# Patient Record
Sex: Female | Born: 1965 | Race: White | Hispanic: No | Marital: Married | Smoking: Former smoker
Health system: Southern US, Community
[De-identification: ages and names within clinical notes are randomized; demographics above are authoritative.]

---

## 1997-07-02 ENCOUNTER — Ambulatory Visit (HOSPITAL_COMMUNITY): Admission: RE | Admit: 1997-07-02 | Discharge: 1997-07-02 | Payer: Self-pay | Admitting: Family Medicine

## 1997-11-09 ENCOUNTER — Emergency Department (HOSPITAL_COMMUNITY): Admission: EM | Admit: 1997-11-09 | Discharge: 1997-11-09 | Payer: Self-pay | Admitting: Emergency Medicine

## 1999-08-15 ENCOUNTER — Emergency Department (HOSPITAL_COMMUNITY): Admission: EM | Admit: 1999-08-15 | Discharge: 1999-08-15 | Payer: Self-pay | Admitting: Emergency Medicine

## 2000-12-12 ENCOUNTER — Ambulatory Visit (HOSPITAL_COMMUNITY): Admission: RE | Admit: 2000-12-12 | Discharge: 2000-12-12 | Payer: Self-pay | Admitting: Internal Medicine

## 2000-12-12 ENCOUNTER — Encounter: Payer: Self-pay | Admitting: Internal Medicine

## 2002-05-20 ENCOUNTER — Encounter: Payer: Self-pay | Admitting: Family Medicine

## 2002-05-20 ENCOUNTER — Ambulatory Visit (HOSPITAL_COMMUNITY): Admission: RE | Admit: 2002-05-20 | Discharge: 2002-05-20 | Payer: Self-pay | Admitting: Family Medicine

## 2004-05-05 ENCOUNTER — Emergency Department (HOSPITAL_COMMUNITY): Admission: EM | Admit: 2004-05-05 | Discharge: 2004-05-05 | Payer: Self-pay | Admitting: Emergency Medicine

## 2005-03-27 ENCOUNTER — Inpatient Hospital Stay (HOSPITAL_COMMUNITY): Admission: EM | Admit: 2005-03-27 | Discharge: 2005-04-05 | Payer: Self-pay | Admitting: Emergency Medicine

## 2005-04-06 ENCOUNTER — Ambulatory Visit: Payer: Self-pay | Admitting: Family Medicine

## 2005-07-22 ENCOUNTER — Emergency Department (HOSPITAL_COMMUNITY): Admission: EM | Admit: 2005-07-22 | Discharge: 2005-07-22 | Payer: Self-pay | Admitting: Emergency Medicine

## 2006-05-12 ENCOUNTER — Emergency Department (HOSPITAL_COMMUNITY): Admission: EM | Admit: 2006-05-12 | Discharge: 2006-05-12 | Payer: Self-pay | Admitting: Emergency Medicine

## 2007-07-20 IMAGING — US US PELVIS COMPLETE MODIFY
1 series · 14 of 25 positions shown · non-contrast
Comparison: none

CLINICAL DATA: Pelvic pain.  Cystic pelvic mass and enlarged uterus seen on CT.
 TRANSABDOMINAL AND TRANSVAGINAL PELVIC ULTRASOUND:
TECHNIQUE: Both transabdominal and transvaginal ultrasound examinations of the pelvis were performed including evaluation of the uterus, ovaries, adnexal regions, and pelvic cul-de-sac.

[Series 1: gyn · 0.27mm/px · 14 of 61 slices shown]
[im 1/61]
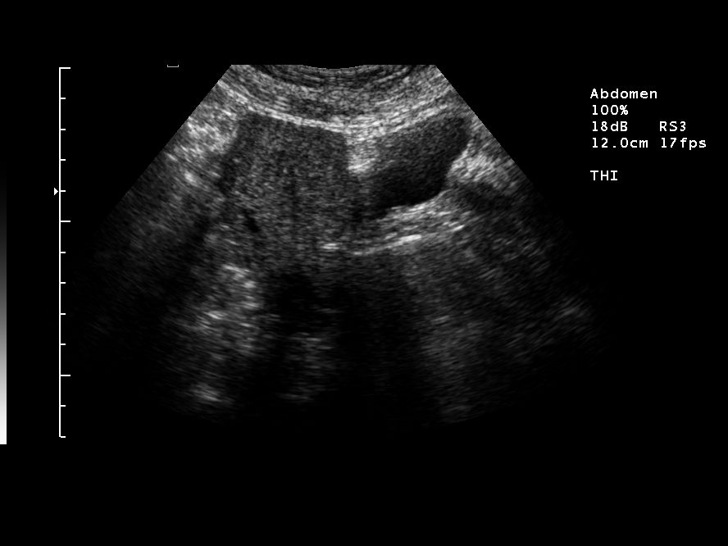
[im 6/61]
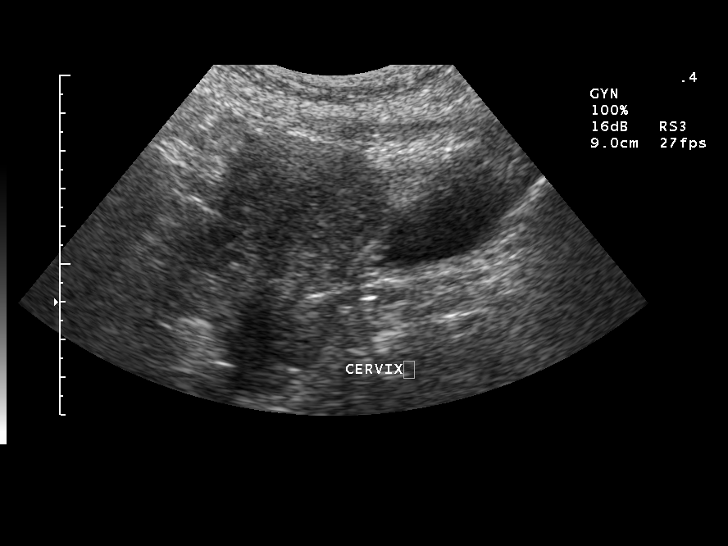
[im 11/61]
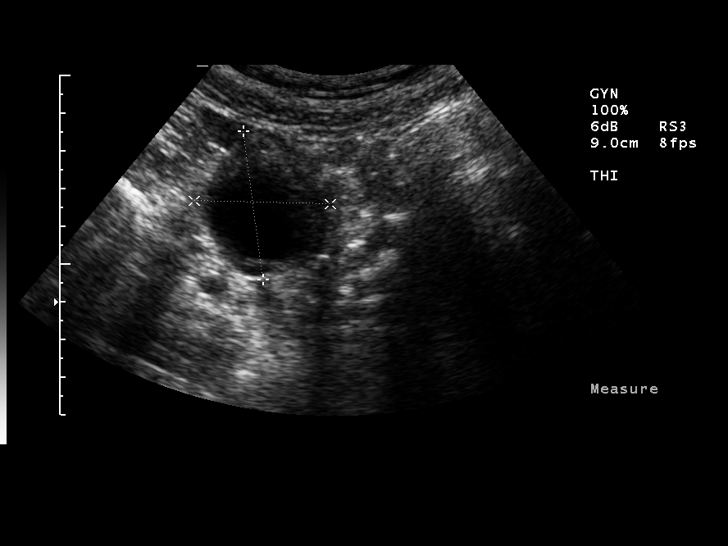
[im 16/61]
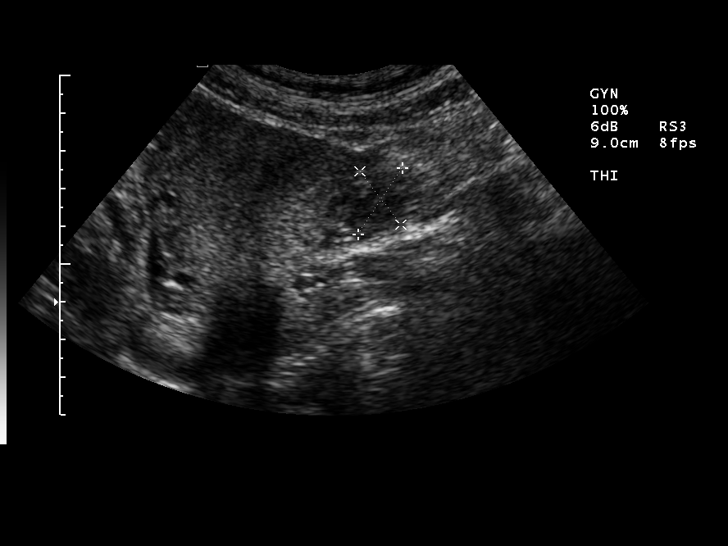
[im 21/61]
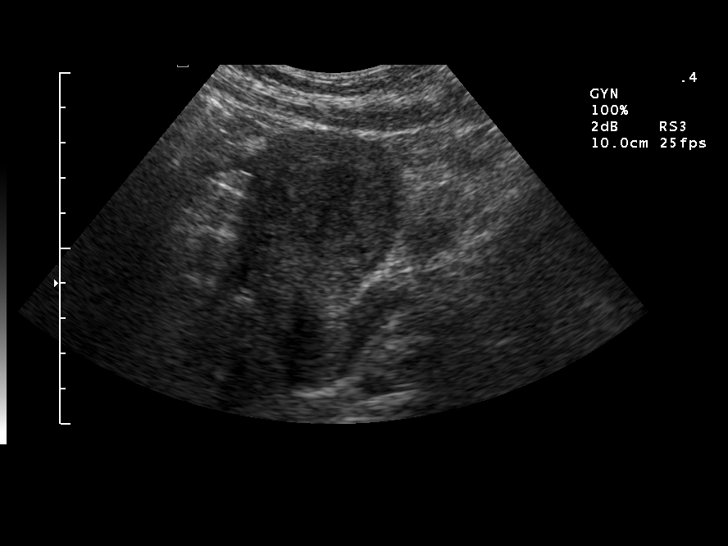
[im 23/61]
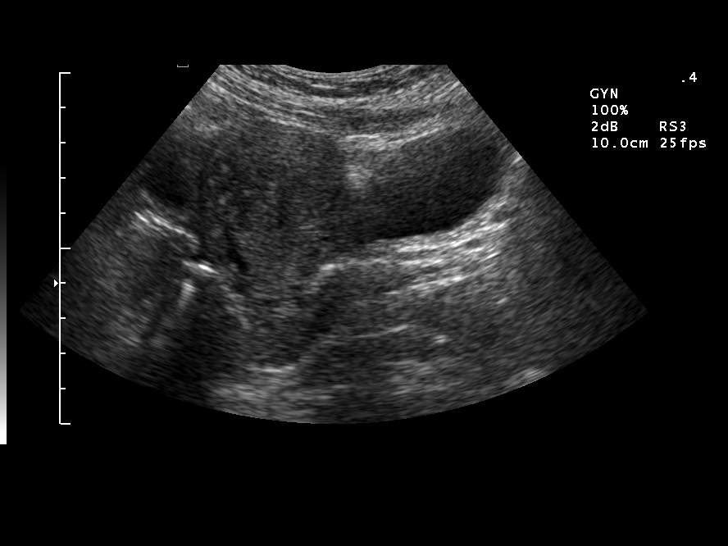
[im 28/61]
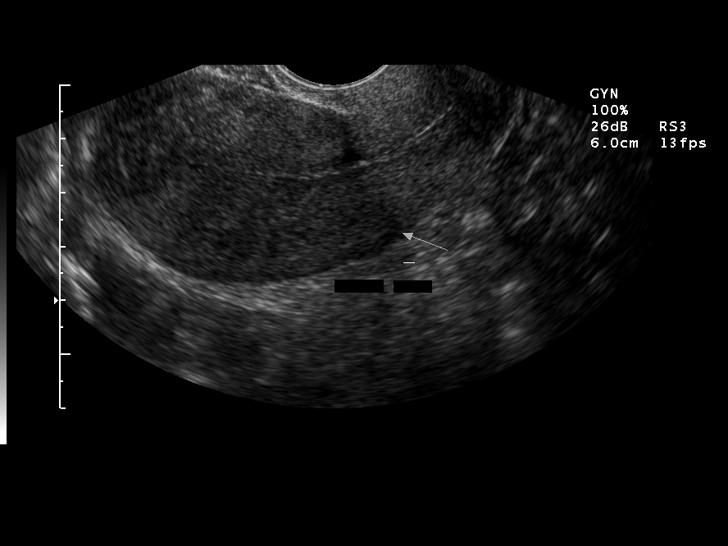
[im 33/61]
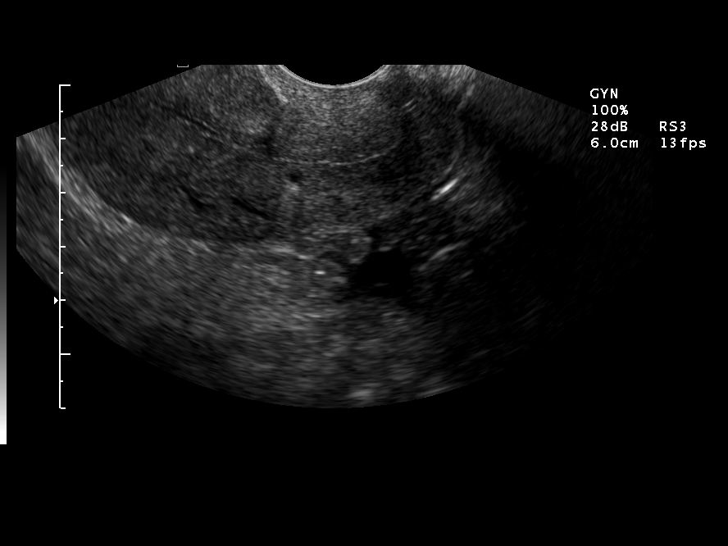
[im 38/61]
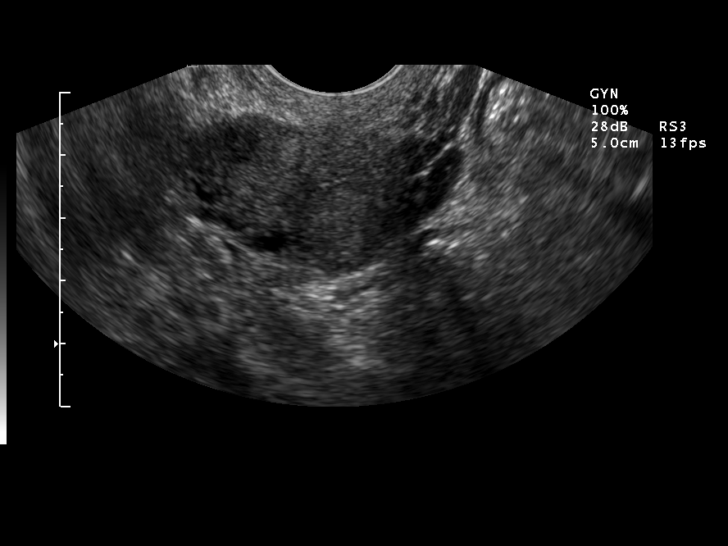
[im 41/61]
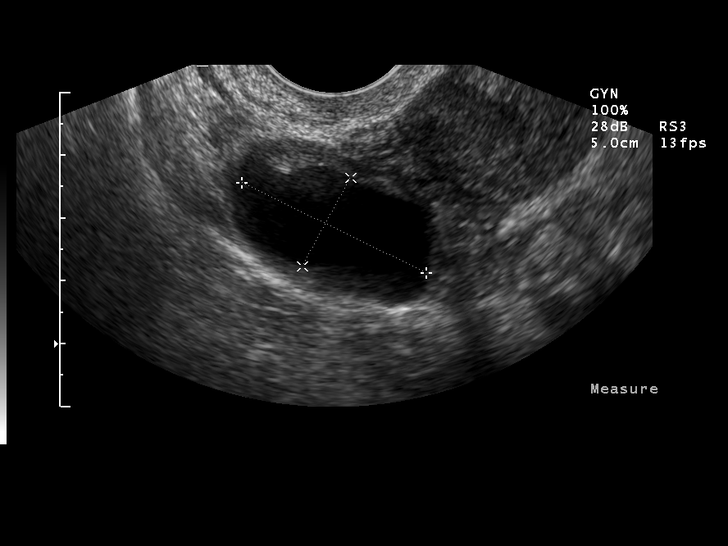
[im 46/61]
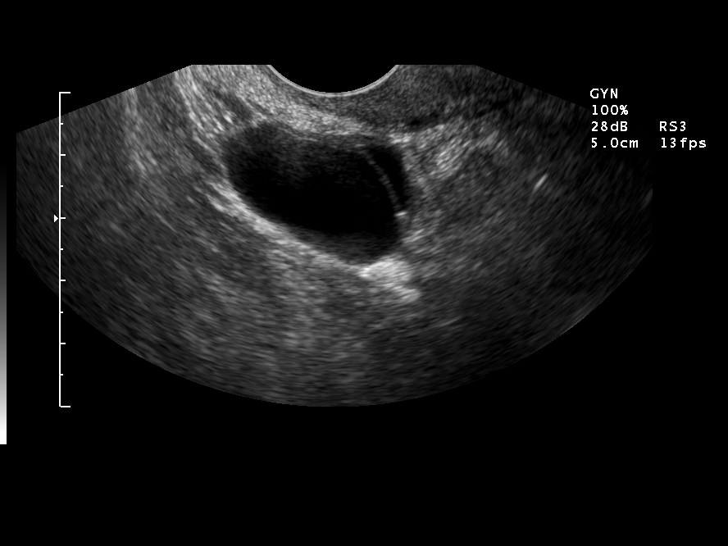
[im 51/61]
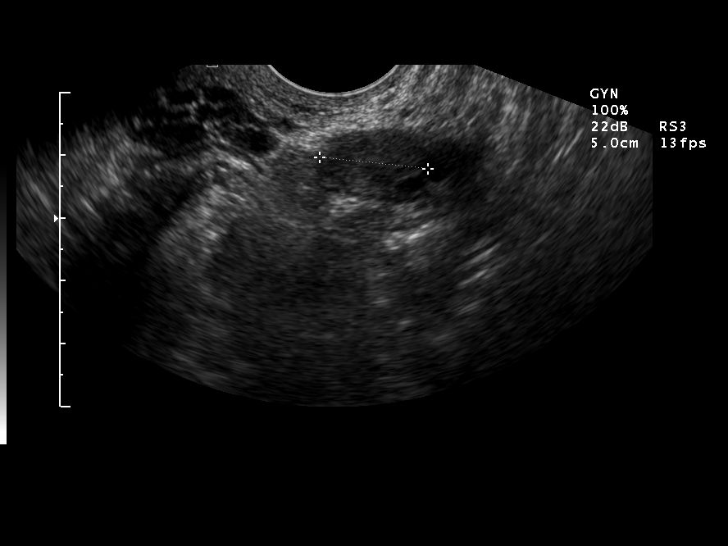
[im 56/61]
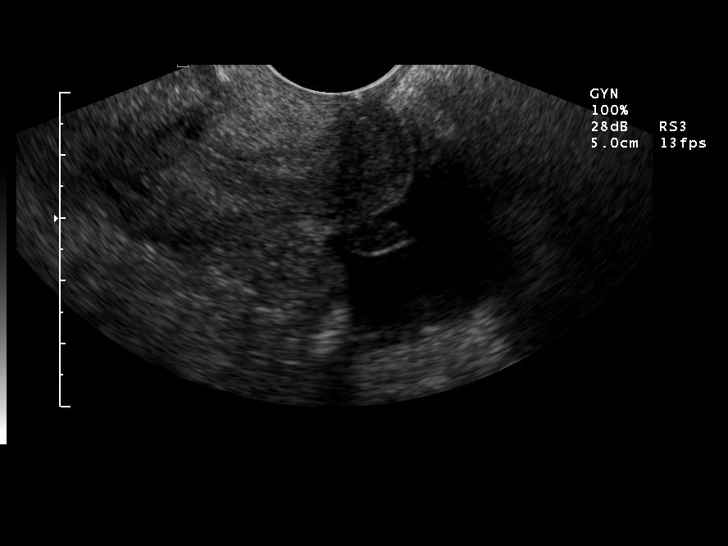
[im 61/61]
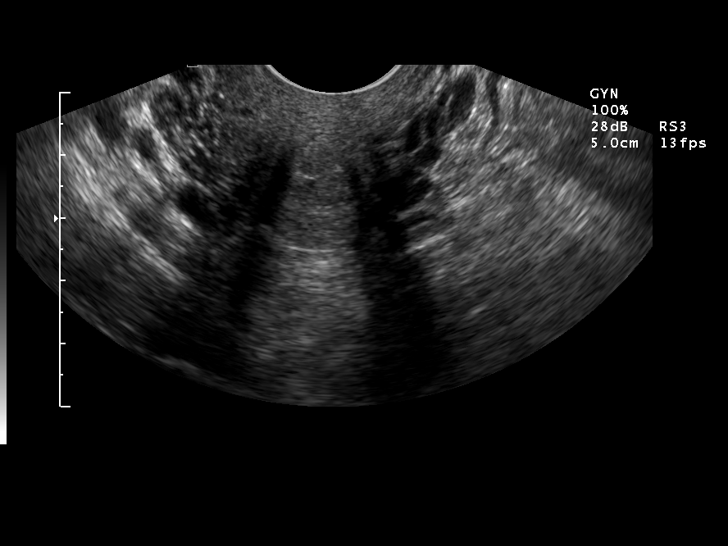

[14 of 25 positions shown; findings below may reference images not displayed]

FINDINGS: The uterus is normal in size.  No fibroids or other uterine masses are seen.  A thin endometrium is seen measuring approximately 4 mm in maximum double-layer thickness.  Prior C-section scar is incidentally noted.  
 A cystic lesion is seen involving the right ovary which has several thin septations along its medial aspect.  This measures 3.3 x 1.6 x 1.5 cm.  No mural nodularity, thickened septation, or solid component is identified.  
 The left ovary is normal in appearance.  No other adnexal masses are seen.  A trace amount of fluid is noted in the pelvic cul-de-sac.
IMPRESSION: 1.  3.5 cm complex septated right ovarian cyst.  This may represent a functional ovarian cyst, and follow-up transvaginal ultrasound is recommended in six weeks.
 2.  Normal appearance of the left ovary and uterus.

## 2013-08-05 ENCOUNTER — Ambulatory Visit: Payer: Self-pay

## 2013-08-06 ENCOUNTER — Encounter: Payer: Self-pay | Admitting: Internal Medicine

## 2013-08-06 ENCOUNTER — Ambulatory Visit: Payer: Self-pay | Attending: Internal Medicine | Admitting: Internal Medicine

## 2013-08-06 VITALS — BP 104/71 | HR 88 | Temp 98.0°F | Resp 16 | Ht 64.0 in | Wt 191.2 lb

## 2013-08-06 DIAGNOSIS — L409 Psoriasis, unspecified: Secondary | ICD-10-CM

## 2013-08-06 DIAGNOSIS — Z139 Encounter for screening, unspecified: Secondary | ICD-10-CM

## 2013-08-06 DIAGNOSIS — L408 Other psoriasis: Secondary | ICD-10-CM

## 2013-08-06 DIAGNOSIS — Z8679 Personal history of other diseases of the circulatory system: Secondary | ICD-10-CM

## 2013-08-06 DIAGNOSIS — R609 Edema, unspecified: Secondary | ICD-10-CM

## 2013-08-06 MED ORDER — CLOBETASOL PROPIONATE 0.05 % EX CREA
1.0000 | TOPICAL_CREAM | Freq: Two times a day (BID) | CUTANEOUS | Status: AC
Start: 2013-08-06 — End: ?

## 2013-08-06 MED ORDER — FUROSEMIDE 20 MG PO TABS: 20.0000 mg | ORAL_TABLET | Freq: Every day | ORAL | Status: AC

## 2013-08-06 NOTE — Progress Notes (Signed)
Patient here to establish care.  Patient has a psoriasis flare up. Patient also has bilateral lower extremity edema (2-3+), with tenderness, itching and tingling for 2-3 months Hx of mitral valve prolapse dx at age 48 or [age].  Unknown last mammogram, patient has breast implants.  Patient c/o numbness in left arm.

## 2013-08-06 NOTE — Progress Notes (Signed)
Patient ID: Monica Ferguson, female   DOB: 02/13/1966, 48 y.o.   MRN: [id number]  [id number]  [id number]  DOB - 01/21/1966  CC:  Chief Complaint  Patient presents with  . Establish Care       HPI: Monica Ferguson is a 48 y.o. female here today to establish medical care.  Patient is having a psoriasis exacerbation, she reports that she ha been using halobetasol cream that usually helps.  She reports that she has had current flare for 6-8 months.  Patient reports that she was diagnosed with psoriasis at age 89, but has never had a flare this bad.  Has had BLE swelling for past 3 weeks.  Reports random chest pain/pressure yesterday.  Denies SOB. Patient is worried about her current breast implant from a fall she had several months ago.  Patient also reports a history of mitral valve prolapse and is worried that her BLE swelling is from her heart.     No Known Allergies No past medical history on file. No current outpatient prescriptions on file prior to visit.   No current facility-administered medications on file prior to visit.   No family history on file. History   Social History  . Marital Status: Married    Spouse Name: N/A    Number of Children: N/A  . Years of Education: N/A   Occupational History  . Not on file.   Social History Main Topics  . Smoking status: Former Smoker    Quit date: 08/06/2012  . Smokeless tobacco: Not on file  . Alcohol Use: Not on file  . Drug Use: Not on file  . Sexual Activity: Not on file   Other Topics Concern  . Not on file   Social History Narrative  . No narrative on file   Review of Systems  HENT: Negative.   Eyes: Negative.   Respiratory: Negative for cough and shortness of breath.   Cardiovascular: Positive for leg swelling (BLE). Negative for chest pain and palpitations.  Gastrointestinal: Negative.   Genitourinary: Negative.   Musculoskeletal: Negative.   Neurological: Positive for tingling (numbness in left arm).  Negative for dizziness and weakness.  Endo/Heme/Allergies: Negative.   Psychiatric/Behavioral: Negative.      Objective:   Filed Vitals:   08/06/13 1419  BP: 104/71  Pulse: 88  Temp: 98 F (36.7 C)  Resp: 16    Physical Exam: Constitutional: Patient appears well-developed and well-nourished. No distress. HENT: Normocephalic, atraumatic, External right and left ear normal. Oropharynx is clear and moist.  Eyes: Conjunctivae and EOM are normal. PERRLA, no scleral icterus. Neck: Normal ROM. Neck supple. No JVD. No tracheal deviation. No thyromegaly. CVS: RRR, S1/S2 +, no murmurs, no gallops, no carotid bruit.  Pulmonary: Effort and breath sounds normal, no stridor, rhonchi, wheezes, rales.  Abdominal: Soft. BS +, no distension, tenderness, rebound or guarding.  Musculoskeletal: Normal range of motion. 2+ non-pitting edema in BLE.  Lymphadenopathy: No lymphadenopathy noted, cervical Neuro: Alert. Normal reflexes, muscle tone coordination. No cranial nerve deficit. Skin: Skin is warm and dry. No rash noted. Not diaphoretic. No erythema. No pallor. Psychiatric: Normal mood and affect. Behavior, judgment, thought content normal.  No results found for this basename: WBC, HGB, HCT, MCV, PLT   No results found for this basename: CREATININE, BUN, NA, K, CL, CO2    No results found for this basename: HGBA1C   Lipid Panel  No results found for this basename: chol, trig, hdl, cholhdl, vldl, ldlcalc  Assessment and plan:   Bufford Lopeudra was seen today for no specified reason.  Diagnoses and associated orders for this visit:  Edema - furosemide (LASIX) 20 MG tablet; Take 1 tablet (20 mg total) by mouth daily. Patient should elevate feet when at home. If no improvement in 2 weeks, RTC.  History of mitral valve prolapse - 2D Echocardiogram without contrast; Future  Psoriasis - clobetasol cream (TEMOVATE) 0.05 %; Apply 1 application topically 2 (two) times daily. - Ambulatory  referral to Rheumatology  Screening - Cancel: MM Digital Screening; Future - COMPLETE METABOLIC PANEL WITH GFR; Future - CBC; Future - TSH; Future - Hemoglobin A1c; Future - Lipid panel; Future - MM DIGITAL SCREENING W/ IMPLANTS BILATERAL; Future   Return in about 1 day (around 08/07/2013) for lab, 3 mon PCP.  The patient was given clear instructions to go to ER or return to medical center if symptoms don't improve, worsen or new problems develop.   Holland CommonsKECK, VALERIE, NP-C [facility] [phone] 08/10/2013, 2:47 PM

## 2013-08-06 NOTE — Patient Instructions (Signed)
Edema Edema is an abnormal buildup of fluids in your bodytissues. Edema is somewhatdependent on gravity to pull the fluid to the lowest place in your body. That makes the condition more common in the legs and thighs (lower extremities). Painless swelling of the feet and ankles is common and becomes more likely as you get older. It is also common in looser tissues, like around your eyes.  When the affected area is squeezed, the fluid may move out of that spot and leave a dent for a few moments. This dent is called pitting.  CAUSES  There are many possible causes of edema. Eating too much salt and being on your feet or sitting for a long time can cause edema in your legs and ankles. Hot weather may make edema worse. Common medical causes of edema include:  Heart failure.  Liver disease.  Kidney disease.  Weak blood vessels in your legs.  Cancer.  An injury.  Pregnancy.  Some medications.  Obesity. SYMPTOMS  Edema is usually painless.Your skin may look swollen or shiny.  DIAGNOSIS  Your health care Libby Goehring may be able to diagnose edema by asking about your medical history and doing a physical exam. You may need to have tests such as X-rays, an electrocardiogram, or blood tests to check for medical conditions that may cause edema.  TREATMENT  Edema treatment depends on the cause. If you have heart, liver, or kidney disease, you need the treatment appropriate for these conditions. General treatment may include:  Elevation of the affected body part above the level of your heart.  Compression of the affected body part. Pressure from elastic bandages or support stockings squeezes the tissues and forces fluid back into the blood vessels. This keeps fluid from entering the tissues.  Restriction of fluid and salt intake.  Use of a water pill (diuretic). These medications are appropriate only for some types of edema. They pull fluid out of your body and make you urinate more often. This  gets rid of fluid and reduces swelling, but diuretics can have side effects. Only use diuretics as directed by your health care Mayci Haning. HOME CARE INSTRUCTIONS   Keep the affected body part above the level of your heart when you are lying down.   Do not sit still or stand for prolonged periods.   Do not put anything directly under your knees when lying down.  Do not wear constricting clothing or garters on your upper legs.   Exercise your legs to work the fluid back into your blood vessels. This may help the swelling go down.   Wear elastic bandages or support stockings to reduce ankle swelling as directed by your health care Abishai Viegas.   Eat a low-salt diet to reduce fluid if your health care Juelz Whittenberg recommends it.   Only take medicines as directed by your health care Maylon Sailors. SEEK MEDICAL CARE IF:   Your edema is not responding to treatment.  You have heart, liver, or kidney disease and notice symptoms of edema.  You have edema in your legs that does not improve after elevating them.   You have sudden and unexplained weight gain. SEEK IMMEDIATE MEDICAL CARE IF:   You develop shortness of breath or chest pain.   You cannot breathe when you lie down.  You develop pain, redness, or warmth in the swollen areas.   You have heart, liver, or kidney disease and suddenly get edema.  You have a fever and your symptoms suddenly get worse. MAKE SURE YOU:     Understand these instructions.  Will watch your condition.  Will get help right away if you are not doing well or get worse. Document Released: 02/05/2005 Document Revised: 02/10/2013 Document Reviewed: 11/28/2012 [employer] Patient Information 2015 [employer]. This information is not intended to replace advice given to you by your health care Kelyn Ponciano. Make sure you discuss any questions you have with your health care Dvaughn Fickle.  

## 2013-08-07 ENCOUNTER — Other Ambulatory Visit: Payer: Self-pay

## 2013-08-11 ENCOUNTER — Ambulatory Visit: Payer: Self-pay | Attending: Internal Medicine

## 2013-08-11 DIAGNOSIS — Z139 Encounter for screening, unspecified: Secondary | ICD-10-CM

## 2013-08-11 LAB — COMPLETE METABOLIC PANEL WITH GFR
ALT: 21 U/L (ref 0–35)
AST: 22 U/L (ref 0–37)
Albumin: 4.4 g/dL (ref 3.5–5.2)
Alkaline Phosphatase: 74 U/L (ref 39–117)
BILIRUBIN TOTAL: 0.4 mg/dL (ref 0.2–1.2)
BUN: 14 mg/dL (ref 6–23)
CALCIUM: 9.7 mg/dL (ref 8.4–10.5)
CHLORIDE: 102 meq/L (ref 96–112)
CO2: 30 meq/L (ref 19–32)
CREATININE: 0.99 mg/dL (ref 0.50–1.10)
GFR, EST AFRICAN AMERICAN: 79 mL/min
GFR, EST NON AFRICAN AMERICAN: 69 mL/min
GLUCOSE: 87 mg/dL (ref 70–99)
Potassium: 4.9 mEq/L (ref 3.5–5.3)
Sodium: 140 mEq/L (ref 135–145)
Total Protein: 7 g/dL (ref 6.0–8.3)

## 2013-08-11 LAB — LIPID PANEL
CHOLESTEROL: 175 mg/dL (ref 0–200)
HDL: 42 mg/dL (ref >39)
LDL CALC: 91 mg/dL (ref 0–99)
TRIGLYCERIDES: 208 mg/dL — AB (ref <150)
Total CHOL/HDL Ratio: 4.2 Ratio
VLDL: 42 mg/dL — AB (ref 0–40)

## 2013-08-11 LAB — CBC
HCT: 39.1 % (ref 36.0–46.0)
HEMOGLOBIN: 13.5 g/dL (ref 12.0–15.0)
MCH: 29.2 pg (ref 26.0–34.0)
MCHC: 34.5 g/dL (ref 30.0–36.0)
MCV: 84.4 fL (ref 78.0–100.0)
PLATELETS: 323 10*3/uL (ref 150–400)
RBC: 4.63 MIL/uL (ref 3.87–5.11)
RDW: 14.2 % (ref 11.5–15.5)
WBC: 6.6 10*3/uL (ref 4.0–10.5)

## 2013-08-11 LAB — HEMOGLOBIN A1C
HEMOGLOBIN A1C: 5.9 % — AB (ref <5.7)
Mean Plasma Glucose: 123 mg/dL — ABNORMAL HIGH (ref <117)

## 2013-08-12 LAB — TSH: TSH: 6.303 u[IU]/mL — AB (ref 0.350–4.500)

## 2013-08-14 ENCOUNTER — Ambulatory Visit
Admission: RE | Admit: 2013-08-14 | Discharge: 2013-08-14 | Disposition: A | Discharge status: Home / Self Care | Payer: Self-pay | Source: Ambulatory Visit | Attending: Internal Medicine | Admitting: Internal Medicine

## 2013-08-14 DIAGNOSIS — Z139 Encounter for screening, unspecified: Secondary | ICD-10-CM

## 2013-08-20 ENCOUNTER — Telehealth: Payer: Self-pay | Admitting: *Deleted

## 2013-08-20 NOTE — Telephone Encounter (Signed)
Patient called wanting her results. Informed patient her thyroid level is elevated and per Cletus GashV. Keck, NP she needs to come back in for another lab (Free T4). Patient has an appt. On 08/27/2013 for orange card. Patient states she will do it then.

## 2013-08-24 ENCOUNTER — Telehealth: Payer: Self-pay | Admitting: *Deleted

## 2013-08-24 ENCOUNTER — Ambulatory Visit (HOSPITAL_COMMUNITY): Payer: Self-pay | Attending: Cardiology | Admitting: Cardiology

## 2013-08-24 DIAGNOSIS — R7989 Other specified abnormal findings of blood chemistry: Secondary | ICD-10-CM

## 2013-08-24 DIAGNOSIS — I379 Nonrheumatic pulmonary valve disorder, unspecified: Secondary | ICD-10-CM | POA: Insufficient documentation

## 2013-08-24 DIAGNOSIS — Z8679 Personal history of other diseases of the circulatory system: Secondary | ICD-10-CM

## 2013-08-24 DIAGNOSIS — I059 Rheumatic mitral valve disease, unspecified: Secondary | ICD-10-CM | POA: Insufficient documentation

## 2013-08-24 DIAGNOSIS — I079 Rheumatic tricuspid valve disease, unspecified: Secondary | ICD-10-CM | POA: Insufficient documentation

## 2013-08-24 DIAGNOSIS — R609 Edema, unspecified: Secondary | ICD-10-CM | POA: Insufficient documentation

## 2013-08-24 NOTE — Progress Notes (Signed)
Echo performed. 

## 2013-08-25 ENCOUNTER — Telehealth: Payer: Self-pay | Admitting: *Deleted

## 2013-08-25 NOTE — Telephone Encounter (Signed)
Patient returning call regarding lab results. Informed patient her mammogram was normal and she will need another mammogram in one year. Also notified patient her echo was normal.

## 2013-08-25 NOTE — Telephone Encounter (Signed)
Left message with patient's daughter and on home VM to have patient return call to discuss test results.

## 2013-08-25 NOTE — Telephone Encounter (Signed)
Message copied by Fredderick SeveranceUCATTE, Chenoah Mcnally L on Tue Aug 25, 2013  3:18 PM ------      Message from: Holland CommonsKECK, VALERIE A      Created: Mon Aug 24, 2013  5:15 PM       Echo is normal. ------

## 2013-08-27 ENCOUNTER — Other Ambulatory Visit: Payer: Self-pay

## 2013-08-27 ENCOUNTER — Ambulatory Visit: Payer: Self-pay

## 2022-01-19 DIAGNOSIS — F902 Attention-deficit hyperactivity disorder, combined type: Secondary | ICD-10-CM | POA: Diagnosis not present

## 2022-01-19 DIAGNOSIS — Z419 Encounter for procedure for purposes other than remedying health state, unspecified: Secondary | ICD-10-CM | POA: Diagnosis not present

## 2022-02-01 DIAGNOSIS — F902 Attention-deficit hyperactivity disorder, combined type: Secondary | ICD-10-CM | POA: Diagnosis not present

## 2022-02-14 DIAGNOSIS — F902 Attention-deficit hyperactivity disorder, combined type: Secondary | ICD-10-CM | POA: Diagnosis not present

## 2022-02-19 DIAGNOSIS — Z419 Encounter for procedure for purposes other than remedying health state, unspecified: Secondary | ICD-10-CM | POA: Diagnosis not present

## 2022-03-12 ENCOUNTER — Telehealth: Payer: Self-pay

## 2022-03-12 NOTE — Telephone Encounter (Signed)
LVM. AS CMA

## 2022-03-14 DIAGNOSIS — F5102 Adjustment insomnia: Secondary | ICD-10-CM | POA: Diagnosis not present

## 2022-03-14 DIAGNOSIS — F901 Attention-deficit hyperactivity disorder, predominantly hyperactive type: Secondary | ICD-10-CM | POA: Diagnosis not present

## 2022-03-22 DIAGNOSIS — Z419 Encounter for procedure for purposes other than remedying health state, unspecified: Secondary | ICD-10-CM | POA: Diagnosis not present

## 2022-04-20 DIAGNOSIS — Z419 Encounter for procedure for purposes other than remedying health state, unspecified: Secondary | ICD-10-CM | POA: Diagnosis not present

## 2022-05-09 DIAGNOSIS — F5102 Adjustment insomnia: Secondary | ICD-10-CM | POA: Diagnosis not present

## 2022-05-09 DIAGNOSIS — F901 Attention-deficit hyperactivity disorder, predominantly hyperactive type: Secondary | ICD-10-CM | POA: Diagnosis not present

## 2022-05-21 DIAGNOSIS — Z419 Encounter for procedure for purposes other than remedying health state, unspecified: Secondary | ICD-10-CM | POA: Diagnosis not present

## 2022-06-06 DIAGNOSIS — F5102 Adjustment insomnia: Secondary | ICD-10-CM | POA: Diagnosis not present

## 2022-06-06 DIAGNOSIS — F901 Attention-deficit hyperactivity disorder, predominantly hyperactive type: Secondary | ICD-10-CM | POA: Diagnosis not present

## 2022-06-20 DIAGNOSIS — Z419 Encounter for procedure for purposes other than remedying health state, unspecified: Secondary | ICD-10-CM | POA: Diagnosis not present

## 2022-07-04 DIAGNOSIS — F5102 Adjustment insomnia: Secondary | ICD-10-CM | POA: Diagnosis not present

## 2022-07-04 DIAGNOSIS — F901 Attention-deficit hyperactivity disorder, predominantly hyperactive type: Secondary | ICD-10-CM | POA: Diagnosis not present

## 2022-07-21 DIAGNOSIS — Z419 Encounter for procedure for purposes other than remedying health state, unspecified: Secondary | ICD-10-CM | POA: Diagnosis not present

## 2022-08-01 DIAGNOSIS — F901 Attention-deficit hyperactivity disorder, predominantly hyperactive type: Secondary | ICD-10-CM | POA: Diagnosis not present

## 2022-08-01 DIAGNOSIS — F5102 Adjustment insomnia: Secondary | ICD-10-CM | POA: Diagnosis not present

## 2022-08-20 DIAGNOSIS — Z419 Encounter for procedure for purposes other than remedying health state, unspecified: Secondary | ICD-10-CM | POA: Diagnosis not present

## 2022-08-29 DIAGNOSIS — F901 Attention-deficit hyperactivity disorder, predominantly hyperactive type: Secondary | ICD-10-CM | POA: Diagnosis not present

## 2022-08-29 DIAGNOSIS — F5102 Adjustment insomnia: Secondary | ICD-10-CM | POA: Diagnosis not present

## 2022-09-20 DIAGNOSIS — Z419 Encounter for procedure for purposes other than remedying health state, unspecified: Secondary | ICD-10-CM | POA: Diagnosis not present

## 2022-09-26 DIAGNOSIS — F5102 Adjustment insomnia: Secondary | ICD-10-CM | POA: Diagnosis not present

## 2022-09-26 DIAGNOSIS — F901 Attention-deficit hyperactivity disorder, predominantly hyperactive type: Secondary | ICD-10-CM | POA: Diagnosis not present

## 2022-10-21 DIAGNOSIS — Z419 Encounter for procedure for purposes other than remedying health state, unspecified: Secondary | ICD-10-CM | POA: Diagnosis not present

## 2022-10-24 DIAGNOSIS — F5102 Adjustment insomnia: Secondary | ICD-10-CM | POA: Diagnosis not present

## 2022-10-24 DIAGNOSIS — F901 Attention-deficit hyperactivity disorder, predominantly hyperactive type: Secondary | ICD-10-CM | POA: Diagnosis not present

## 2022-11-21 DIAGNOSIS — F5102 Adjustment insomnia: Secondary | ICD-10-CM | POA: Diagnosis not present

## 2022-11-21 DIAGNOSIS — F901 Attention-deficit hyperactivity disorder, predominantly hyperactive type: Secondary | ICD-10-CM | POA: Diagnosis not present

## 2022-12-19 DIAGNOSIS — F5102 Adjustment insomnia: Secondary | ICD-10-CM | POA: Diagnosis not present

## 2022-12-19 DIAGNOSIS — F901 Attention-deficit hyperactivity disorder, predominantly hyperactive type: Secondary | ICD-10-CM | POA: Diagnosis not present

## 2022-12-21 DIAGNOSIS — Z419 Encounter for procedure for purposes other than remedying health state, unspecified: Secondary | ICD-10-CM | POA: Diagnosis not present

## 2023-01-16 DIAGNOSIS — F5102 Adjustment insomnia: Secondary | ICD-10-CM | POA: Diagnosis not present

## 2023-01-16 DIAGNOSIS — F902 Attention-deficit hyperactivity disorder, combined type: Secondary | ICD-10-CM | POA: Diagnosis not present

## 2023-01-20 DIAGNOSIS — Z419 Encounter for procedure for purposes other than remedying health state, unspecified: Secondary | ICD-10-CM | POA: Diagnosis not present

## 2023-02-14 DIAGNOSIS — F902 Attention-deficit hyperactivity disorder, combined type: Secondary | ICD-10-CM | POA: Diagnosis not present

## 2023-02-14 DIAGNOSIS — F5102 Adjustment insomnia: Secondary | ICD-10-CM | POA: Diagnosis not present

## 2023-02-20 DIAGNOSIS — Z419 Encounter for procedure for purposes other than remedying health state, unspecified: Secondary | ICD-10-CM | POA: Diagnosis not present

## 2023-03-14 DIAGNOSIS — F902 Attention-deficit hyperactivity disorder, combined type: Secondary | ICD-10-CM | POA: Diagnosis not present

## 2023-03-14 DIAGNOSIS — F5102 Adjustment insomnia: Secondary | ICD-10-CM | POA: Diagnosis not present

## 2023-03-23 DIAGNOSIS — Z419 Encounter for procedure for purposes other than remedying health state, unspecified: Secondary | ICD-10-CM | POA: Diagnosis not present

## 2023-04-10 DIAGNOSIS — F902 Attention-deficit hyperactivity disorder, combined type: Secondary | ICD-10-CM | POA: Diagnosis not present

## 2023-04-10 DIAGNOSIS — F5102 Adjustment insomnia: Secondary | ICD-10-CM | POA: Diagnosis not present

## 2023-04-20 DIAGNOSIS — Z419 Encounter for procedure for purposes other than remedying health state, unspecified: Secondary | ICD-10-CM | POA: Diagnosis not present

## 2023-05-08 DIAGNOSIS — F5102 Adjustment insomnia: Secondary | ICD-10-CM | POA: Diagnosis not present

## 2023-05-08 DIAGNOSIS — F902 Attention-deficit hyperactivity disorder, combined type: Secondary | ICD-10-CM | POA: Diagnosis not present

## 2023-06-01 DIAGNOSIS — Z419 Encounter for procedure for purposes other than remedying health state, unspecified: Secondary | ICD-10-CM | POA: Diagnosis not present

## 2023-06-05 DIAGNOSIS — F5102 Adjustment insomnia: Secondary | ICD-10-CM | POA: Diagnosis not present

## 2023-06-05 DIAGNOSIS — F902 Attention-deficit hyperactivity disorder, combined type: Secondary | ICD-10-CM | POA: Diagnosis not present

## 2023-07-01 DIAGNOSIS — Z419 Encounter for procedure for purposes other than remedying health state, unspecified: Secondary | ICD-10-CM | POA: Diagnosis not present

## 2023-07-03 DIAGNOSIS — F5102 Adjustment insomnia: Secondary | ICD-10-CM | POA: Diagnosis not present

## 2023-07-03 DIAGNOSIS — F902 Attention-deficit hyperactivity disorder, combined type: Secondary | ICD-10-CM | POA: Diagnosis not present

## 2023-07-17 DIAGNOSIS — F902 Attention-deficit hyperactivity disorder, combined type: Secondary | ICD-10-CM | POA: Diagnosis not present

## 2023-07-17 DIAGNOSIS — F5102 Adjustment insomnia: Secondary | ICD-10-CM | POA: Diagnosis not present

## 2023-07-31 DIAGNOSIS — F902 Attention-deficit hyperactivity disorder, combined type: Secondary | ICD-10-CM | POA: Diagnosis not present

## 2023-07-31 DIAGNOSIS — F5102 Adjustment insomnia: Secondary | ICD-10-CM | POA: Diagnosis not present

## 2023-08-01 DIAGNOSIS — Z419 Encounter for procedure for purposes other than remedying health state, unspecified: Secondary | ICD-10-CM | POA: Diagnosis not present

## 2023-08-22 DIAGNOSIS — H5213 Myopia, bilateral: Secondary | ICD-10-CM | POA: Diagnosis not present

## 2023-08-28 DIAGNOSIS — F902 Attention-deficit hyperactivity disorder, combined type: Secondary | ICD-10-CM | POA: Diagnosis not present

## 2023-08-28 DIAGNOSIS — F5102 Adjustment insomnia: Secondary | ICD-10-CM | POA: Diagnosis not present

## 2023-08-31 DIAGNOSIS — Z419 Encounter for procedure for purposes other than remedying health state, unspecified: Secondary | ICD-10-CM | POA: Diagnosis not present

## 2023-09-25 DIAGNOSIS — F5102 Adjustment insomnia: Secondary | ICD-10-CM | POA: Diagnosis not present

## 2023-09-25 DIAGNOSIS — F902 Attention-deficit hyperactivity disorder, combined type: Secondary | ICD-10-CM | POA: Diagnosis not present

## 2023-10-01 DIAGNOSIS — Z419 Encounter for procedure for purposes other than remedying health state, unspecified: Secondary | ICD-10-CM | POA: Diagnosis not present

## 2023-10-18 DIAGNOSIS — F902 Attention-deficit hyperactivity disorder, combined type: Secondary | ICD-10-CM | POA: Diagnosis not present

## 2023-10-18 DIAGNOSIS — F5102 Adjustment insomnia: Secondary | ICD-10-CM | POA: Diagnosis not present

## 2023-10-24 DIAGNOSIS — G4700 Insomnia, unspecified: Secondary | ICD-10-CM | POA: Diagnosis not present

## 2023-10-24 DIAGNOSIS — F902 Attention-deficit hyperactivity disorder, combined type: Secondary | ICD-10-CM | POA: Diagnosis not present

## 2023-11-01 DIAGNOSIS — Z419 Encounter for procedure for purposes other than remedying health state, unspecified: Secondary | ICD-10-CM | POA: Diagnosis not present

## 2023-11-21 DIAGNOSIS — G4700 Insomnia, unspecified: Secondary | ICD-10-CM | POA: Diagnosis not present

## 2023-11-21 DIAGNOSIS — F902 Attention-deficit hyperactivity disorder, combined type: Secondary | ICD-10-CM | POA: Diagnosis not present

## 2023-11-21 DIAGNOSIS — Z7151 Drug abuse counseling and surveillance of drug abuser: Secondary | ICD-10-CM | POA: Diagnosis not present

## 2023-12-19 DIAGNOSIS — G4700 Insomnia, unspecified: Secondary | ICD-10-CM | POA: Diagnosis not present

## 2023-12-19 DIAGNOSIS — F902 Attention-deficit hyperactivity disorder, combined type: Secondary | ICD-10-CM | POA: Diagnosis not present

## 2023-12-19 DIAGNOSIS — F5102 Adjustment insomnia: Secondary | ICD-10-CM | POA: Diagnosis not present

## 2023-12-19 DIAGNOSIS — Z7151 Drug abuse counseling and surveillance of drug abuser: Secondary | ICD-10-CM | POA: Diagnosis not present
